# Patient Record
Sex: Male | Born: 1975 | Race: White | Hispanic: Yes | Marital: Single | Smoking: Never smoker
Health system: Southern US, Community
[De-identification: ages and names within clinical notes are randomized; demographics above are authoritative.]

## PROBLEM LIST (undated history)

## (undated) DIAGNOSIS — G8929 Other chronic pain: Secondary | ICD-10-CM

## (undated) DIAGNOSIS — F32A Depression, unspecified: Secondary | ICD-10-CM

## (undated) DIAGNOSIS — D649 Anemia, unspecified: Secondary | ICD-10-CM

## (undated) DIAGNOSIS — I10 Essential (primary) hypertension: Secondary | ICD-10-CM

## (undated) HISTORY — DX: Depression, unspecified: F32.A

## (undated) HISTORY — DX: Anemia, unspecified: D64.9

## (undated) HISTORY — PX: CHOLECYSTECTOMY: SHX55

## (undated) HISTORY — DX: Other chronic pain: G89.29

## (undated) HISTORY — PX: UPPER GASTROINTESTINAL ENDOSCOPY: SHX188

## (undated) HISTORY — DX: Essential (primary) hypertension: I10

## (undated) HISTORY — PX: APPENDECTOMY: SHX54

---

## 2017-09-10 ENCOUNTER — Emergency Department (HOSPITAL_BASED_OUTPATIENT_CLINIC_OR_DEPARTMENT_OTHER)
Admission: EM | Admit: 2017-09-10 | Discharge: 2017-09-10 | Disposition: A | Discharge status: Home / Self Care | Payer: BLUE CROSS/BLUE SHIELD | Attending: Emergency Medicine | Admitting: Emergency Medicine

## 2017-09-10 ENCOUNTER — Encounter (HOSPITAL_BASED_OUTPATIENT_CLINIC_OR_DEPARTMENT_OTHER): Payer: Self-pay

## 2017-09-10 ENCOUNTER — Other Ambulatory Visit: Payer: Self-pay

## 2017-09-10 ENCOUNTER — Emergency Department (HOSPITAL_BASED_OUTPATIENT_CLINIC_OR_DEPARTMENT_OTHER): Payer: BLUE CROSS/BLUE SHIELD

## 2017-09-10 DIAGNOSIS — K529 Noninfective gastroenteritis and colitis, unspecified: Secondary | ICD-10-CM | POA: Diagnosis not present

## 2017-09-10 DIAGNOSIS — R112 Nausea with vomiting, unspecified: Secondary | ICD-10-CM | POA: Insufficient documentation

## 2017-09-10 DIAGNOSIS — R1011 Right upper quadrant pain: Secondary | ICD-10-CM | POA: Diagnosis present

## 2017-09-10 LAB — COMPREHENSIVE METABOLIC PANEL
ALT: 27 U/L (ref 17–63)
AST: 29 U/L (ref 15–41)
Albumin: 4.9 g/dL (ref 3.5–5.0)
Alkaline Phosphatase: 120 U/L (ref 38–126)
Anion gap: 13 (ref 5–15)
BUN: 37 mg/dL — ABNORMAL HIGH (ref 6–20)
CO2: 21 mmol/L — ABNORMAL LOW (ref 22–32)
Calcium: 9.4 mg/dL (ref 8.9–10.3)
Chloride: 103 mmol/L (ref 101–111)
Creatinine, Ser: 1.63 mg/dL — ABNORMAL HIGH (ref 0.61–1.24)
GFR calc Af Amer: 59 mL/min — ABNORMAL LOW (ref >60)
GFR calc non Af Amer: 50 mL/min — ABNORMAL LOW (ref >60)
Glucose, Bld: 125 mg/dL — ABNORMAL HIGH (ref 65–99)
Potassium: 3.3 mmol/L — ABNORMAL LOW (ref 3.5–5.1)
Sodium: 137 mmol/L (ref 135–145)
Total Bilirubin: 0.6 mg/dL (ref 0.3–1.2)
Total Protein: 8.3 g/dL — ABNORMAL HIGH (ref 6.5–8.1)

## 2017-09-10 LAB — CBC WITH DIFFERENTIAL/PLATELET
Basophils Absolute: 0 10*3/uL (ref 0.0–0.1)
Basophils Relative: 0 %
Eosinophils Absolute: 0 10*3/uL (ref 0.0–0.7)
Eosinophils Relative: 0 %
HCT: 47.4 % (ref 39.0–52.0)
Hemoglobin: 16.3 g/dL (ref 13.0–17.0)
Lymphocytes Relative: 6 %
Lymphs Abs: 0.9 10*3/uL (ref 0.7–4.0)
MCH: 26.5 pg (ref 26.0–34.0)
MCHC: 34.4 g/dL (ref 30.0–36.0)
MCV: 76.9 fL — ABNORMAL LOW (ref 78.0–100.0)
Monocytes Absolute: 1 10*3/uL (ref 0.1–1.0)
Monocytes Relative: 7 %
Neutro Abs: 13.1 10*3/uL — ABNORMAL HIGH (ref 1.7–7.7)
Neutrophils Relative %: 87 %
Platelets: 318 10*3/uL (ref 150–400)
RBC: 6.16 MIL/uL — ABNORMAL HIGH (ref 4.22–5.81)
RDW: 14.9 % (ref 11.5–15.5)
WBC: 15 10*3/uL — ABNORMAL HIGH (ref 4.0–10.5)

## 2017-09-10 LAB — URINALYSIS, MICROSCOPIC (REFLEX): Bacteria, UA: NONE SEEN

## 2017-09-10 LAB — URINALYSIS, ROUTINE W REFLEX MICROSCOPIC
Bilirubin Urine: NEGATIVE
Glucose, UA: NEGATIVE mg/dL
Ketones, ur: 15 mg/dL — AB
Leukocytes, UA: NEGATIVE
Nitrite: NEGATIVE
Protein, ur: NEGATIVE mg/dL
Specific Gravity, Urine: 1.01 (ref 1.005–1.030)
pH: 6 (ref 5.0–8.0)

## 2017-09-10 LAB — LIPASE, BLOOD: Lipase: 21 U/L (ref 11–51)

## 2017-09-10 MED ORDER — SODIUM CHLORIDE 0.9 % IV BOLUS
1000.0000 mL | Freq: Once | INTRAVENOUS | Status: AC
  Administered 2017-09-10: 1000 mL via INTRAVENOUS

## 2017-09-10 MED ORDER — FAMOTIDINE 20 MG PO TABS: 20.0000 mg | ORAL_TABLET | Freq: Two times a day (BID) | ORAL | 0 refills | Status: DC

## 2017-09-10 MED ORDER — METOCLOPRAMIDE HCL 10 MG PO TABS: 10.0000 mg | ORAL_TABLET | Freq: Four times a day (QID) | ORAL | 0 refills | Status: DC

## 2017-09-10 MED ORDER — HYDROCODONE-ACETAMINOPHEN 5-325 MG PO TABS: 1.0000 | ORAL_TABLET | Freq: Four times a day (QID) | ORAL | 0 refills | Status: DC | PRN

## 2017-09-10 MED ORDER — IOPAMIDOL (ISOVUE-300) INJECTION 61%
100.0000 mL | Freq: Once | INTRAVENOUS | Status: AC | PRN
  Administered 2017-09-10: 100 mL via INTRAVENOUS

## 2017-09-10 MED ORDER — ONDANSETRON HCL 4 MG PO TABS: 4.0000 mg | ORAL_TABLET | Freq: Four times a day (QID) | ORAL | 0 refills | Status: DC

## 2017-09-10 MED ORDER — CIPROFLOXACIN HCL 500 MG PO TABS: 500.0000 mg | ORAL_TABLET | Freq: Two times a day (BID) | ORAL | 0 refills | Status: DC

## 2017-09-10 MED ORDER — ONDANSETRON HCL 4 MG/2ML IJ SOLN
4.0000 mg | Freq: Once | INTRAMUSCULAR | Status: AC
  Administered 2017-09-10: 4 mg via INTRAVENOUS
  Filled 2017-09-10: qty 2

## 2017-09-10 MED ORDER — MORPHINE SULFATE (PF) 4 MG/ML IV SOLN
4.0000 mg | Freq: Once | INTRAVENOUS | Status: AC
  Administered 2017-09-10: 4 mg via INTRAVENOUS
  Filled 2017-09-10: qty 1

## 2017-09-10 MED ORDER — METOCLOPRAMIDE HCL 5 MG/ML IJ SOLN
10.0000 mg | Freq: Once | INTRAMUSCULAR | Status: AC
  Administered 2017-09-10: 10 mg via INTRAVENOUS
  Filled 2017-09-10: qty 2

## 2017-09-10 MED ORDER — METRONIDAZOLE 500 MG PO TABS: 500.0000 mg | ORAL_TABLET | Freq: Three times a day (TID) | ORAL | 0 refills | Status: DC

## 2017-09-10 NOTE — ED Triage Notes (Signed)
Emesis and abdominal pain x 2 days. Diarrhea yesterday. No sick contacts. Sent from [facility].

## 2017-09-10 NOTE — ED Provider Notes (Signed)
[facility] EMERGENCY DEPARTMENT Provider Note   CSN: [id number] Arrival date & time: 09/10/17  1638     History   Chief Complaint Chief Complaint  Patient presents with  . Abdominal Pain  . Emesis    HPI Nathan Hanna. is a 42 y.o. male with history of appendectomy and cholecystectomy who presents with a 2-day history of abdominal pain, nausea, vomiting.  Patient had one episode of nonbloody diarrhea yesterday.  Patient describes his pain is sharp and constant.  He has felt warm at home, but no documented fever.  He was evaluated in urgent care prior to arrival who sent him here for further evaluation.  No medications taken prior to arrival.  Patient denies any chest pain, shortness of breath, urinary symptoms, back pain.  HPI  History reviewed. No pertinent past medical history.  There are no active problems to display for this patient.   Past Surgical History:  Procedure Laterality Date  . APPENDECTOMY    . CHOLECYSTECTOMY          Home Medications    Prior to Admission medications   Medication Sig Start Date End Date Taking? Authorizing Provider  citalopram (CELEXA) 40 MG tablet Take 40 mg by mouth daily.   Yes [provider]  verapamil (VERELAN PM) 240 MG 24 hr capsule Take 240 mg by mouth at bedtime.   Yes [provider]  ciprofloxacin (CIPRO) 500 MG tablet Take 1 tablet (500 mg total) by mouth every 12 (twelve) hours. 09/10/17   Tyvon Eggenberger, Waylan Boga, PA-C  famotidine (PEPCID) 20 MG tablet Take 1 tablet (20 mg total) by mouth 2 (two) times daily. 09/10/17   Clodagh Odenthal, Waylan Boga, PA-C  HYDROcodone-acetaminophen (NORCO/VICODIN) 5-325 MG tablet Take 1-2 tablets by mouth every 6 (six) hours as needed. 09/10/17   Merelin Human, Waylan Boga, PA-C  metoCLOPramide (REGLAN) 10 MG tablet Take 1 tablet (10 mg total) by mouth every 6 (six) hours. 09/10/17   Joandry Slagter, Waylan Boga, PA-C  metroNIDAZOLE (FLAGYL) 500 MG tablet Take 1 tablet (500 mg total) by mouth 3  (three) times daily. 09/10/17   Emi Holes, PA-C    Family History No family history on file.  Social History Social History   Tobacco Use  . Smoking status: Never Smoker  . Smokeless tobacco: Never Used  Substance Use Topics  . Alcohol use: Not Currently  . Drug use: Not Currently     Allergies   Patient has no known allergies.   Review of Systems Review of Systems  Constitutional: Negative for chills and fever.  HENT: Negative for facial swelling and sore throat.   Respiratory: Negative for shortness of breath.   Cardiovascular: Negative for chest pain.  Gastrointestinal: Positive for abdominal pain, diarrhea, nausea and vomiting.  Genitourinary: Negative for dysuria.  Musculoskeletal: Negative for back pain.  Skin: Negative for rash and wound.  Neurological: Negative for headaches.  Psychiatric/Behavioral: The patient is not nervous/anxious.      Physical Exam Updated Vital Signs BP 132/81 (BP Location: Right Arm)   Pulse 63   Temp 98.5 F (36.9 C) (Oral)   Resp 18   Ht 5\' 3"  (1.6 m)   Wt 81.6 kg (180 lb)   SpO2 100%   BMI 31.89 kg/m   Physical Exam  Constitutional: He appears well-developed and well-nourished. No distress.  HENT:  Head: Normocephalic and atraumatic.  Mouth/Throat: Oropharynx is clear and moist. No oropharyngeal exudate.  Eyes: Pupils are equal, round, and reactive to  light. Conjunctivae are normal. Right eye exhibits no discharge. Left eye exhibits no discharge. No scleral icterus.  Neck: Normal range of motion. Neck supple. No thyromegaly present.  Cardiovascular: Normal rate, regular rhythm, normal heart sounds and intact distal pulses. Exam reveals no gallop and no friction rub.  No murmur heard. Pulmonary/Chest: Effort normal and breath sounds normal. No stridor. No respiratory distress. He has no wheezes. He has no rales.  Abdominal: Soft. Bowel sounds are normal. He exhibits no distension. There is tenderness. There is no  rebound, no guarding, no CVA tenderness, no tenderness at McBurney's point and negative Murphy's sign.    Musculoskeletal: He exhibits no edema.  Lymphadenopathy:    He has no cervical adenopathy.  Neurological: He is alert. Coordination normal.  Skin: Skin is warm and dry. No rash noted. He is not diaphoretic. No pallor.  Psychiatric: He has a normal mood and affect.  Nursing note and vitals reviewed.    ED Treatments / Results  Labs (all labs ordered are listed, but only abnormal results are displayed) Labs Reviewed  COMPREHENSIVE METABOLIC PANEL - Abnormal; Notable for the following components:      Result Value   Potassium 3.3 (*)    CO2 21 (*)    Glucose, Bld 125 (*)    BUN 37 (*)    Creatinine, Ser 1.63 (*)    Total Protein 8.3 (*)    GFR calc non Af Amer 50 (*)    GFR calc Af Amer 59 (*)    All other components within normal limits  CBC WITH DIFFERENTIAL/PLATELET - Abnormal; Notable for the following components:   WBC 15.0 (*)    RBC 6.16 (*)    MCV 76.9 (*)    Neutro Abs 13.1 (*)    All other components within normal limits  URINALYSIS, ROUTINE W REFLEX MICROSCOPIC - Abnormal; Notable for the following components:   Hgb urine dipstick TRACE (*)    Ketones, ur 15 (*)    All other components within normal limits  URINALYSIS, MICROSCOPIC (REFLEX) - Abnormal; Notable for the following components:   Squamous Epithelial / LPF 0-5 (*)    All other components within normal limits  LIPASE, BLOOD    EKG None  Radiology Ct Abdomen Pelvis W Contrast  Result Date: 09/10/2017 CLINICAL DATA:  Midline abdominal pain for 2 days, nausea, vomiting and diarrhea. History of appendectomy and cholecystectomy. EXAM: CT ABDOMEN AND PELVIS WITH CONTRAST TECHNIQUE: Multidetector CT imaging of the abdomen and pelvis was performed using the standard protocol following bolus administration of intravenous contrast. CONTRAST:  100mL ISOVUE-300 IOPAMIDOL (ISOVUE-300) INJECTION 61%  COMPARISON:  CT abdomen and pelvis report April 13, 2011 though images are not available for direct comparison. FINDINGS: LOWER CHEST: Lung bases are clear. Included heart size is normal. No pericardial effusion. Subcentimeter RIGHT posterior mediastinal lymph node or cyst. Mild gas distended distal esophagus with air-fluid level most compatible with reflux. HEPATOBILIARY: Mild focal fatty infiltration about the falciform ligament. Status post cholecystectomy. PANCREAS: Normal. SPLEEN: Normal. ADRENALS/URINARY TRACT: Kidneys are orthotopic, demonstrating symmetric enhancement. No nephrolithiasis, hydronephrosis or solid renal masses. The unopacified ureters are normal in course and caliber. Delayed imaging through the kidneys demonstrates symmetric prompt contrast excretion within the proximal urinary collecting system. Urinary bladder is partially distended, normal. Normal adrenal glands. STOMACH/BOWEL: Mild fluid distended small and large bowel with air-fluid levels, multiple mild transition points. No abnormal bowel wall thickening. No pneumatosis. VASCULAR/LYMPHATIC: Aortoiliac vessels are normal in course and caliber. No  lymphadenopathy by CT size criteria. REPRODUCTIVE: Normal. OTHER: No intraperitoneal free fluid or free air. MUSCULOSKELETAL: Nonacute. Sacroiliac osteophytes. Severe L5-S1 degenerative disc. Mild to moderate lower lumbar facet arthropathy. Severe RIGHT L5-S1 neural foraminal narrowing. IMPRESSION: 1. Small and large bowel air-fluid levels seen with enteritis, no complication. 2. Status post cholecystectomy and appendectomy. Electronically Signed   By: Awilda Metro M.D.   On: 09/10/2017 18:05    Procedures Procedures (including critical care time)  Medications Ordered in ED Medications  morphine 4 MG/ML injection 4 mg (4 mg Intravenous Given 09/10/17 1743)  ondansetron (ZOFRAN) injection 4 mg (4 mg Intravenous Given 09/10/17 1743)  iopamidol (ISOVUE-300) 61 % injection 100 mL  (100 mLs Intravenous Contrast Given 09/10/17 1729)  sodium chloride 0.9 % bolus 1,000 mL (0 mLs Intravenous Stopped 09/10/17 1758)  sodium chloride 0.9 % bolus 1,000 mL (0 mLs Intravenous Stopped 09/10/17 1931)  morphine 4 MG/ML injection 4 mg (4 mg Intravenous Given 09/10/17 1930)  ondansetron (ZOFRAN) injection 4 mg (4 mg Intravenous Given 09/10/17 1955)  metoCLOPramide (REGLAN) injection 10 mg (10 mg Intravenous Given 09/10/17 2028)     Initial Impression / Assessment and Plan / ED Course  I have reviewed the triage vital signs and the nursing notes.  Pertinent labs & imaging results that were available during my care of the patient were reviewed by me and considered in my medical decision making (see chart for details).     Patient with enteritis seen on CT scan in the small and large bowel.  WBC 15.0.  CMP shows BUN 37, creatinine 1.63.  Suspect due to dehydration.  Patient given 2 L of NS in the ED.  Will have patient recheck to PCP in 2-3 days.  Pain and nausea controlled in the ED.  Will discharge home with Cipro, Flagyl to cover for bacterial enteritis, as well as Reglan, Pepcid, and short course of Norco.  I reviewed the [place] narcotic database and found no discrepancies.  Return precautions discussed.  Patient understands and agrees with plan.  Patient vitals stable throughout ED course and discharged in satisfactory condition. I discussed patient case with Dr. Denton Lank who guided the patient's management and agrees with plan.   Final Clinical Impressions(s) / ED Diagnoses   Final diagnoses:  Non-intractable vomiting with nausea, unspecified vomiting type  Right upper quadrant abdominal pain  Enteritis    ED Discharge Orders        Ordered    ciprofloxacin (CIPRO) 500 MG tablet  Every 12 hours     09/10/17 2016    metroNIDAZOLE (FLAGYL) 500 MG tablet  3 times daily     09/10/17 2016    HYDROcodone-acetaminophen (NORCO/VICODIN) 5-325 MG tablet  Every 6 hours PRN,   Status:   Discontinued     09/10/17 2016    ondansetron (ZOFRAN) 4 MG tablet  Every 6 hours,   Status:  Discontinued     09/10/17 2016    famotidine (PEPCID) 20 MG tablet  2 times daily     09/10/17 2016    HYDROcodone-acetaminophen (NORCO/VICODIN) 5-325 MG tablet  Every 6 hours PRN     09/10/17 2032    metoCLOPramide (REGLAN) 10 MG tablet  Every 6 hours     09/10/17 2032       Emi Holes, PA-C 09/10/17 2037    Cathren Laine, MD 09/10/17 2244

## 2017-09-10 NOTE — ED Notes (Signed)
Pt attempting to urinate at this time

## 2017-09-10 NOTE — Discharge Instructions (Addendum)
Medications: Cipro, Flagyl, Reglan, Pepcid, Norco  Treatment: Take Cipro and Flagyl until completed.  Do not drink alcohol with Flagyl.  Take Reglan every 6 hours as needed for nausea or vomiting.  Take Pepcid twice daily as prescribed to help with irritation in your stomach lining.  Take 1-2 Norco every 6 hours as needed for your pain.   Do not drink alcohol, drive, operate machinery or participate in any other potentially dangerous activities while taking opiate pain medication as it may make you sleepy. Do not take this medication with any other sedating medications, either prescription or over-the-counter. If you were prescribed Percocet or Vicodin, do not take these with acetaminophen (Tylenol) as it is already contained within these medications and overdose of Tylenol is dangerous.   This medication is an opiate (or narcotic) pain medication and can be habit forming.  Use it as little as possible to achieve adequate pain control.  Do not use or use it with extreme caution if you have a history of opiate abuse or dependence. This medication is intended for your use only - do not give any to anyone else and keep it in a secure place where nobody else, especially children, have access to it. It will also cause or worsen constipation, so you may want to consider taking an over-the-counter stool softener while you are taking this medication.  Follow-up: Please see your doctor in 2-3 days for recheck of symptoms as well as your kidney function.  You are very dehydrated today.  Please return to the emergency department if you develop any new or worsening symptoms including severe, worsening abdominal pain, intractable vomiting, passing out, or any other new or concerning symptom.

## 2017-09-10 NOTE — ED Notes (Signed)
Pt returned from CT °

## 2020-08-06 ENCOUNTER — Encounter (HOSPITAL_BASED_OUTPATIENT_CLINIC_OR_DEPARTMENT_OTHER): Payer: Self-pay

## 2020-08-06 ENCOUNTER — Emergency Department (HOSPITAL_BASED_OUTPATIENT_CLINIC_OR_DEPARTMENT_OTHER): Payer: Worker's Compensation

## 2020-08-06 ENCOUNTER — Other Ambulatory Visit: Payer: Self-pay

## 2020-08-06 ENCOUNTER — Emergency Department (HOSPITAL_BASED_OUTPATIENT_CLINIC_OR_DEPARTMENT_OTHER)
Admission: EM | Admit: 2020-08-06 | Discharge: 2020-08-06 | Disposition: A | Discharge status: Home / Self Care | Payer: Worker's Compensation | Attending: Emergency Medicine | Admitting: Emergency Medicine

## 2020-08-06 DIAGNOSIS — Y92512 Supermarket, store or market as the place of occurrence of the external cause: Secondary | ICD-10-CM | POA: Insufficient documentation

## 2020-08-06 DIAGNOSIS — R109 Unspecified abdominal pain: Secondary | ICD-10-CM | POA: Diagnosis not present

## 2020-08-06 DIAGNOSIS — S8012XA Contusion of left lower leg, initial encounter: Secondary | ICD-10-CM | POA: Diagnosis not present

## 2020-08-06 DIAGNOSIS — W228XXA Striking against or struck by other objects, initial encounter: Secondary | ICD-10-CM | POA: Insufficient documentation

## 2020-08-06 DIAGNOSIS — S8992XA Unspecified injury of left lower leg, initial encounter: Secondary | ICD-10-CM | POA: Diagnosis present
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
## 2020-08-20 ENCOUNTER — Encounter: Payer: Self-pay | Admitting: Nurse Practitioner

## 2020-09-17 ENCOUNTER — Ambulatory Visit (INDEPENDENT_AMBULATORY_CARE_PROVIDER_SITE_OTHER): Payer: BC Managed Care – PPO | Admitting: Nurse Practitioner

## 2020-09-17 ENCOUNTER — Encounter: Payer: Self-pay | Admitting: Nurse Practitioner

## 2020-09-17 ENCOUNTER — Other Ambulatory Visit: Payer: Self-pay

## 2020-09-17 VITALS — BP 130/82 | HR 89 | Ht 62.0 in | Wt 196.2 lb

## 2020-09-17 DIAGNOSIS — R195 Other fecal abnormalities: Secondary | ICD-10-CM

## 2020-09-17 DIAGNOSIS — R112 Nausea with vomiting, unspecified: Secondary | ICD-10-CM | POA: Diagnosis not present

## 2020-09-17 DIAGNOSIS — K219 Gastro-esophageal reflux disease without esophagitis: Secondary | ICD-10-CM | POA: Diagnosis not present

## 2020-09-17 DIAGNOSIS — D509 Iron deficiency anemia, unspecified: Secondary | ICD-10-CM | POA: Diagnosis not present

## 2020-09-17 MED ORDER — PLENVU 140 G PO SOLR: 1.0000 | Freq: Once | ORAL | 0 refills | Status: AC

## 2020-09-17 MED ORDER — ONDANSETRON HCL 4 MG PO TABS: ORAL_TABLET | ORAL | 0 refills | Status: DC

## 2020-09-17 NOTE — Progress Notes (Signed)
ASSESSMENT AND PLAN    #45 year old male with positive IFOBT+, iron deficiency anemia ( now on iron), chronic, intermittent small volume rectal bleeding,  --Will scheduled for EGD and colonoscopy. The risks and benefits of EGD and colonoscopy with possible polypectomy / biopsies were discussed and the patient agrees to proceed.  --Hold iron 10 days prior to procedures.   # Chronic nausea / vomiting evaluated in [place] in 2008 and sounds like extensive evaluation was negative ( per Aunt who accompanies him today).  --Trial of Zofran prior to each dose of bowel prep.   # ? Vitamin B12 deficiency, level  was low normal at 215 .  Now on B12 replacement  # Chronic GERD --Continue daily Protonix --Be sure to take you anti-reflux medication 30 minutes before meal(s) --Anti-reflux measures discussed including avoidance of trigger foods and evening meals / bedtime snacks. If able, elevate head of bed 6-8 inches. If unable to elevate the head of the bed consider a wedge pillow.   Weight reduction / maintaining a healthy BMI) as increased abdominal girth is associated with reflux.   HISTORY OF PRESENT ILLNESS     Chief Complaint : rectal bleeding, anemia, chronic nausea / vomiting and history of GERD  Nathan Hanna. is a 45 y.o. male with a past medical history significant for iron deficiency anemia, depression, migraines, appendectomy, cholecystectomy, GERD  Patient is referred by PCP for evaluation of GERD. However , he also has iron deficiency and recently had a positive IFOBT  Patient diagnosed with iron deficiency February 2021. Labs reviewed in Care Everywhere. Ferritin was 6 at the time. He was started on iron.  In March 2022 ferritin was only 27, iron saturation of 6%.  Patient says he was compliant in taking the iron. His dose was increased to BID. He also gives a history of occasional painless rectal bleeding for months. He denies constipation. He gets occasional loose stool  depending on diet. No FMH of colon cancer. He hasn't had any black stools, doesn't take NSAIDs on a regular basis. He doesn't donate blood, no epistaxis or hematuria.  No abdominal pain. He has chronic intermittent nausea and vomiting ( sometimes related to migraines). His aunt is present and says this was evaluated in 2008 in [place] and only finding was that his esophagus " was slow".   Patient give a history of chronic GERD with heartburn. He has been taking Protonix for a year which helps. It was prescribed to be taken BID by PCP but he has only taking it once daily with good control of symptoms   Data Reviewed:  07/30/20 [facility] Hgb 14.6, MCV 80, ferritin 27,  TIBC 412, 7 % iron saturation Liver and renal tests normal   Past Medical History:  Diagnosis Date  . Anemia   . Chronic headaches   . Depression   . Hypertension     Past Surgical History:  Procedure Laterality Date  . APPENDECTOMY    . CHOLECYSTECTOMY     No family history on file. Social History   Tobacco Use  . Smoking status: Never Smoker  . Smokeless tobacco: Never Used  Substance Use Topics  . Alcohol use: Yes  . Drug use: Not Currently   Current Outpatient Medications  Medication Sig Dispense Refill  . citalopram (CELEXA) 40 MG tablet Take 40 mg by mouth daily.    . meclizine (ANTIVERT) 25 MG tablet Take 25 mg by mouth 3 (three) times daily as  needed for dizziness. Prn    . pantoprazole (PROTONIX) 20 MG tablet Take 20 mg by mouth daily.    . SUMAtriptan (IMITREX) 50 MG tablet Take 50 mg by mouth every 2 (two) hours as needed for migraine. May repeat in 2 hours if headache persists or recurs.    . verapamil (CALAN-SR) 120 MG CR tablet Take 120 mg by mouth at bedtime.     No current facility-administered medications for this visit.   No Known Allergies   Review of Systems: Positive for headaches.  All other systems reviewed and negative except where noted in HPI.   PHYSICAL EXAM :    Wt Readings from  Last 3 Encounters:  08/06/20 208 lb (94.3 kg)  09/10/17 180 lb (81.6 kg)    BP 130/82   Pulse 89   Ht 5\' 2"  (1.575 m)   Wt 196 lb 3.2 oz (89 kg)   SpO2 99%   BMI 35.89 kg/m  Constitutional:  Pleasant male in no acute distress. Psychiatric: Normal mood and affect. Behavior is normal. EENT: Pupils normal.  Conjunctivae are normal. No scleral icterus. Neck supple.  Cardiovascular: Normal rate, regular rhythm. No edema Pulmonary/chest: Effort normal and breath sounds normal. No wheezing, rales or rhonchi. Abdominal: Soft, nondistended, nontender. Bowel sounds active throughout. There are no masses palpable. No hepatomegaly. Neurological: Alert and oriented to person place and time. Skin: Skin is warm and dry. No rashes noted.  , NP  09/17/2020, 8:45 AM  Cc:  Referring Provider 09/19/2020., MD

## 2020-09-17 NOTE — Patient Instructions (Signed)
We have sent the following medications to your pharmacy for you to pick up at your convenience:  Zofran - take 1 tablet about 30 minutes before drinking each half of the prep.  You have been scheduled for an endoscopy. Please follow written instructions given to you at your visit today. If you use inhalers (even only as needed), please bring them with you on the day of your procedure.

## 2020-09-18 NOTE — Progress Notes (Signed)
Agree with assessment and plan as outlined.  

## 2020-11-18 ENCOUNTER — Ambulatory Visit (AMBULATORY_SURGERY_CENTER): Payer: BC Managed Care – PPO | Admitting: Gastroenterology

## 2020-11-18 ENCOUNTER — Other Ambulatory Visit: Payer: Self-pay

## 2020-11-18 ENCOUNTER — Encounter: Payer: Self-pay | Admitting: Gastroenterology

## 2020-11-18 VITALS — BP 102/64 | HR 58 | Temp 98.9°F | Resp 16 | Ht 62.0 in | Wt 196.0 lb

## 2020-11-18 DIAGNOSIS — K449 Diaphragmatic hernia without obstruction or gangrene: Secondary | ICD-10-CM | POA: Diagnosis not present

## 2020-11-18 DIAGNOSIS — K2970 Gastritis, unspecified, without bleeding: Secondary | ICD-10-CM

## 2020-11-18 DIAGNOSIS — K3189 Other diseases of stomach and duodenum: Secondary | ICD-10-CM | POA: Diagnosis not present

## 2020-11-18 DIAGNOSIS — K5730 Diverticulosis of large intestine without perforation or abscess without bleeding: Secondary | ICD-10-CM | POA: Diagnosis not present

## 2020-11-18 DIAGNOSIS — K648 Other hemorrhoids: Secondary | ICD-10-CM

## 2020-11-18 DIAGNOSIS — D509 Iron deficiency anemia, unspecified: Secondary | ICD-10-CM

## 2020-11-18 DIAGNOSIS — K2950 Unspecified chronic gastritis without bleeding: Secondary | ICD-10-CM

## 2020-11-18 DIAGNOSIS — K219 Gastro-esophageal reflux disease without esophagitis: Secondary | ICD-10-CM

## 2020-11-18 MED ORDER — SODIUM CHLORIDE 0.9 % IV SOLN: 500.0000 mL | Freq: Once | INTRAVENOUS | Status: DC

## 2020-11-18 NOTE — Progress Notes (Signed)
Called to room to assist during endoscopic procedure.  Patient ID and intended procedure confirmed with present staff. Received instructions for my participation in the procedure from the performing physician.  

## 2020-11-18 NOTE — Patient Instructions (Signed)
YOU HAD AN ENDOSCOPIC PROCEDURE TODAY AT THE [facility]:   Refer to the procedure report that was given to you for any specific questions about what was found during the examination.  If the procedure report does not answer your questions, please call your gastroenterologist to clarify.  If you requested that your care partner not be given the details of your procedure findings, then the procedure report has been included in a sealed envelope for you to review at your convenience later.  YOU SHOULD EXPECT: Some feelings of bloating in the abdomen. Passage of more gas than usual.  Walking can help get rid of the air that was put into your GI tract during the procedure and reduce the bloating. If you had a lower endoscopy (such as a colonoscopy or flexible sigmoidoscopy) you may notice spotting of blood in your stool or on the toilet paper. If you underwent a bowel prep for your procedure, you may not have a normal bowel movement for a few days.  Please Note:  You might notice some irritation and congestion in your nose or some drainage.  This is from the oxygen used during your procedure.  There is no need for concern and it should clear up in a day or so.  SYMPTOMS TO REPORT IMMEDIATELY:   Following lower endoscopy (colonoscopy or flexible sigmoidoscopy):  Excessive amounts of blood in the stool  Significant tenderness or worsening of abdominal pains  Swelling of the abdomen that is new, acute  Fever of 100F or higher   Following upper endoscopy (EGD)  Vomiting of blood or coffee ground material  New chest pain or pain under the shoulder blades  Painful or persistently difficult swallowing  New shortness of breath  Fever of 100F or higher  Black, tarry-looking stools  For urgent or emergent issues, a gastroenterologist can be reached at any hour by calling [phone]. Do not use MyChart messaging for urgent concerns.    DIET:  We do recommend a small meal at first, but  then you may proceed to your regular diet.  Drink plenty of fluids but you should avoid alcoholic beverages for 24 hours.  ACTIVITY:  You should plan to take it easy for the rest of today and you should NOT DRIVE or use heavy machinery until tomorrow (because of the sedation medicines used during the test).    FOLLOW UP: Our staff will call the number listed on your records 48-72 hours following your procedure to check on you and address any questions or concerns that you may have regarding the information given to you following your procedure. If we do not reach you, we will leave a message.  We will attempt to reach you two times.  During this call, we will ask if you have developed any symptoms of COVID 19. If you develop any symptoms (ie: fever, flu-like symptoms, shortness of breath, cough etc.) before then, please call [phone].  If you test positive for Covid 19 in the 2 weeks post procedure, please call and report this information to us.    If any biopsies were taken you will be contacted by phone or by letter within the next 1-3 weeks.  Please call us at [phone] if you have not heard about the biopsies in 3 weeks.    SIGNATURES/CONFIDENTIALITY: You and/or your care partner have signed paperwork which will be entered into your electronic medical record.  These signatures attest to the fact that that the information above on   your After Visit Summary has been reviewed and is understood.  Full responsibility of the confidentiality of this discharge information lies with you and/or your care-partner. 

## 2020-11-18 NOTE — Progress Notes (Signed)
VS by CW  I have reviewed the patient's medical history in detail and updated the computerized patient record.  

## 2020-11-18 NOTE — Progress Notes (Signed)
PT taken to PACU. Monitors in place. VSS. Report given to RN. 

## 2020-11-18 NOTE — Op Note (Signed)
[facility] Patient Name: Nathan Hanna Procedure Date: 11/18/2020 1:30 PM MRN: [id number] Endoscopist: Viviann Spare P. Adela Lank , MD Age: 45 Referring MD:  Date of Birth: [birth year] Gender: Male Account #: [id number] Procedure:                Upper GI endoscopy Indications:              Iron deficiency anemia, Follow-up of                            gastro-esophageal reflux disease, Nausea with                            vomiting - on protonix 20mg  / day Medicines:                Monitored Anesthesia Care Procedure:                Pre-Anesthesia Assessment:                           - Prior to the procedure, a History and Physical                            was performed, and patient medications and                            allergies were reviewed. The patient's tolerance of                            previous anesthesia was also reviewed. The risks                            and benefits of the procedure and the sedation                            options and risks were discussed with the patient.                            All questions were answered, and informed consent                            was obtained. Prior Anticoagulants: The patient has                            taken no previous anticoagulant or antiplatelet                            agents. ASA Grade Assessment: II - A patient with                            mild systemic disease. After reviewing the risks                            and benefits, the patient was deemed in  satisfactory condition to undergo the procedure.                           After obtaining informed consent, the endoscope was                            passed under direct vision. Throughout the                            procedure, the patient's blood pressure, pulse, and                            oxygen saturations were monitored continuously. The                            Endoscope was introduced through  the mouth, and                            advanced to the second part of duodenum. The upper                            GI endoscopy was accomplished without difficulty.                            The patient tolerated the procedure well. Scope In: Scope Out: Findings:                 Esophagogastric landmarks were identified: the                            Z-line was found at 35 cm, the gastroesophageal                            junction was found at 35 cm and the upper extent of                            the gastric folds was found at 38 cm from the                            incisors.                           A 3 cm hiatal hernia was present.                           The exam of the esophagus was otherwise normal.                           The stomach had poor air retention due to hiatal                            hernia and laxity at the GEJ, retroflexed views  were limited. The entire examined stomach was                            normal. Biopsies were taken with a cold forceps for                            Helicobacter pylori testing.                           The duodenal bulb and second portion of the                            duodenum were normal. Biopsies for histology were                            taken with a cold forceps for evaluation of celiac                            disease. Complications:            No immediate complications. Estimated blood loss:                            Minimal. Estimated Blood Loss:     Estimated blood loss was minimal. Impression:               - Esophagogastric landmarks identified.                           - 3 cm hiatal hernia.                           - Poor air retention in the stomach                           - Normal stomach. Biopsied.                           - Normal duodenal bulb and second portion of the                            duodenum. Biopsied.                           No overt cause for  iron deficiency on this exam,                            will await biopsy results. Recommendation:           - Patient has a contact number available for                            emergencies. The signs and symptoms of potential                            delayed complications were discussed with the  patient. Return to normal activities tomorrow.                            Written discharge instructions were provided to the                            patient.                           - Resume previous diet.                           - Continue present medications.                           - Await pathology results.                           - Start ferrous sulfate 325mg  daily if not done so                            already, monitor Hgb / iron studies                           - based on pattern of breathing observed during                            anesthesia, would consider sleep study to rule out                            OSA if patient has not had this done before, can                            discuss with his primary care physician P. Viviann Spare, MD 11/18/2020 2:21:25 PM This report has been signed electronically.

## 2020-11-18 NOTE — Op Note (Signed)
[facility] Patient Name: Nathan Hanna Procedure Date: 11/18/2020 1:31 PM MRN: [id number] Endoscopist: Viviann Spare P. Adela Lank , MD Age: 45 Referring MD:  Date of Birth: [birth year] Gender: Male Account #: [id number] Procedure:                Colonoscopy Indications:              Iron deficiency anemia, history of B12 deficiency Medicines:                Monitored Anesthesia Care Procedure:                Pre-Anesthesia Assessment:                           - Prior to the procedure, a History and Physical                            was performed, and patient medications and                            allergies were reviewed. The patient's tolerance of                            previous anesthesia was also reviewed. The risks                            and benefits of the procedure and the sedation                            options and risks were discussed with the patient.                            All questions were answered, and informed consent                            was obtained. Prior Anticoagulants: The patient has                            taken no previous anticoagulant or antiplatelet                            agents. ASA Grade Assessment: II - A patient with                            mild systemic disease. After reviewing the risks                            and benefits, the patient was deemed in                            satisfactory condition to undergo the procedure.                           After obtaining informed consent, the colonoscope  was passed under direct vision. Throughout the                            procedure, the patient's blood pressure, pulse, and                            oxygen saturations were monitored continuously. The                            Olympus CF-HQ190L [id number]) Colonoscope was                            introduced through the anus and advanced to the the                            terminal  ileum, with identification of the                            appendiceal orifice and IC valve. The colonoscopy                            was performed without difficulty. The patient                            tolerated the procedure well. The quality of the                            bowel preparation was good. The terminal ileum,                            ileocecal valve, appendiceal orifice, and rectum                            were photographed. Scope In: 1:53:08 PM Scope Out: 2:12:53 PM Scope Withdrawal Time: 0 hours 11 minutes 55 seconds  Total Procedure Duration: 0 hours 19 minutes 45 seconds  Findings:                 The perianal and digital rectal examinations were                            normal.                           The terminal ileum appeared normal.                           Scattered small-mouthed diverticula were found in                            the entire colon.                           Internal hemorrhoids were found during retroflexion.  The exam was otherwise without abnormality. Complications:            No immediate complications. Estimated blood loss:                            None. Estimated Blood Loss:     Estimated blood loss: none. Impression:               - The examined portion of the ileum was normal.                           - Diverticulosis in the entire examined colon.                           - Internal hemorrhoids.                           - The examination was otherwise normal.                           - No polyps.                           No cause for anemia / iron deficiency on                            colonoscopy, await biopsies from EGD Recommendation:           - Patient has a contact number available for                            emergencies. The signs and symptoms of potential                            delayed complications were discussed with the                            patient. Return to  normal activities tomorrow.                            Written discharge instructions were provided to the                            patient.                           - Resume previous diet.                           - Continue present medications.                           - Further recommendations per EGD note Shelah Heatley P. Loudon Krakow, MD 11/18/2020 2:17:18 PM This report has been signed electronically.

## 2020-11-20 ENCOUNTER — Telehealth: Payer: Self-pay

## 2020-11-20 NOTE — Telephone Encounter (Signed)
  Follow up Call-  Call back number 11/18/2020  Post procedure Call Back phone  # [phone]  Permission to leave phone message Yes  Some recent data might be hidden     Patient questions:  Do you have a fever, pain , or abdominal swelling? No. Pain Score  0 *  Have you tolerated food without any problems? Yes.    Have you been able to return to your normal activities? Yes.    Do you have any questions about your discharge instructions: Diet   No. Medications  No. Follow up visit  No.  Do you have questions or concerns about your Care? No.  Actions: * If pain score is 4 or above: No action needed, pain <4.

## 2020-11-26 ENCOUNTER — Other Ambulatory Visit: Payer: Self-pay

## 2020-11-26 DIAGNOSIS — D509 Iron deficiency anemia, unspecified: Secondary | ICD-10-CM

## 2020-12-24 ENCOUNTER — Telehealth: Payer: Self-pay

## 2020-12-24 NOTE — Telephone Encounter (Signed)
Spoke with patient's mother to remind her that patient is due for repeat labs at this time. No appointment is necessary. Patient's mother is aware that patient can stop by the lab in the basement at his convenience between 7:30 AM - 5 PM, Monday through Friday. Patient's mother verbalized understanding and had no concerns at the end of the call. She will relay this information to the patient.

## 2020-12-24 NOTE — Telephone Encounter (Signed)
-----   Message from Missy Sabins, RN sent at 11/26/2020 12:42 PM EDT ----- Regarding: Labs Repeat IBC + Ferritin. Orders in epic.

## 2021-01-05 ENCOUNTER — Other Ambulatory Visit (INDEPENDENT_AMBULATORY_CARE_PROVIDER_SITE_OTHER): Payer: BC Managed Care – PPO

## 2021-01-05 DIAGNOSIS — D509 Iron deficiency anemia, unspecified: Secondary | ICD-10-CM | POA: Diagnosis not present

## 2021-01-05 LAB — IBC + FERRITIN
Ferritin: 49.3 ng/mL (ref 22.0–322.0)
Iron: 69 ug/dL (ref 42–165)
Saturation Ratios: 16.5 % — ABNORMAL LOW (ref 20.0–50.0)
TIBC: 417.2 ug/dL (ref 250.0–450.0)
Transferrin: 298 mg/dL (ref 212.0–360.0)

## 2021-01-06 ENCOUNTER — Other Ambulatory Visit: Payer: Self-pay

## 2021-01-06 DIAGNOSIS — D509 Iron deficiency anemia, unspecified: Secondary | ICD-10-CM

## 2021-04-08 ENCOUNTER — Telehealth: Payer: Self-pay

## 2021-04-08 NOTE — Telephone Encounter (Signed)
Okay thanks. He should continue present meds and repeat CBC in 6 months at this point.  If he has any recurrent symptoms otherwise he can follow up with me in the interim. Thanks

## 2021-04-08 NOTE — Telephone Encounter (Addendum)
Lm on vm for patient to return call.  Lab reminder in epic. Pt will be due for physical with his PCP in 28-months, will possibly have labs checked at that time.

## 2021-04-08 NOTE — Telephone Encounter (Signed)
-----   Message from Missy Sabins, RN sent at 01/06/2021 11:29 AM EDT ----- Regarding: Labs Repeat IBC + Ferritin, CBC. The orders are in epic.

## 2021-04-08 NOTE — Telephone Encounter (Signed)
Patient had labs drawn with PCP on 04/06/21. Lab results available in epic.

## 2021-04-09 NOTE — Telephone Encounter (Signed)
Called and spoke with patient's mom in regards to recommnedations. She was very apologetic about patient missing labs here. Advised that there was no need to apologize, advised that he still was on schedule with his repeat labs. Mother states that patient will see PCP in May for physical and labs. Advised that we will place a reminder in the system to check those results. Pt's mother verbalized understanding and had no concerns at the end of the call.  Lab reminder in epic.

## 2021-10-12 ENCOUNTER — Telehealth: Payer: Self-pay

## 2021-10-12 NOTE — Telephone Encounter (Signed)
-----   Message from Yevette Edwards, RN sent at 04/09/2021 11:21 AM EST ----- Regarding: Labs Check labs with PCP

## 2021-10-12 NOTE — Telephone Encounter (Signed)
Pt due for repeat labs. Pt had recent labs with PCP. Labs are available in Care Everywhere.

## 2021-10-12 NOTE — Telephone Encounter (Signed)
Called and spoke with patient's mother regarding Dr. Lanetta Inch recommendations. Pt's mother states that patient is taking iron once a day. I advised her to have patient increase iron to BID until his follow up appt. She is aware that this can cause constipation and cause pt's stools to turn black. Pt has been scheduled for a follow up with Dr. Adela Lank on Tuesday, 11/17/21 at 9:50 am. Pt's mother is aware that I will mail this appt information, she confirmed address on file. Pt's mother is aware that if pt remains anemic despite oral iron then he may need a VCE. Pt verbalized understanding and had no concerns at the end of the call.

## 2021-10-12 NOTE — Telephone Encounter (Signed)
Thanks for letting me know.  It appears his mild iron deficiency has recurred.  Not sure if he is still on iron or not when this happened.  Can you let him know to resume ferrous sulfate 325 mg daily.  I have not seen him in a while, I would like to see him back in the office for reassessment if you can help coordinate.  With recurrent IDA, especially if while on iron supplementation, he may need a capsule study.  Thanks

## 2021-11-17 ENCOUNTER — Ambulatory Visit: Payer: BC Managed Care – PPO | Admitting: Gastroenterology

## 2021-11-17 ENCOUNTER — Encounter: Payer: Self-pay | Admitting: Gastroenterology

## 2021-11-17 VITALS — BP 110/82 | HR 64 | Ht 62.0 in | Wt 195.0 lb

## 2021-11-17 DIAGNOSIS — D509 Iron deficiency anemia, unspecified: Secondary | ICD-10-CM

## 2021-11-17 DIAGNOSIS — K219 Gastro-esophageal reflux disease without esophagitis: Secondary | ICD-10-CM

## 2021-11-30 ENCOUNTER — Other Ambulatory Visit (INDEPENDENT_AMBULATORY_CARE_PROVIDER_SITE_OTHER): Payer: BC Managed Care – PPO

## 2021-11-30 DIAGNOSIS — D509 Iron deficiency anemia, unspecified: Secondary | ICD-10-CM | POA: Diagnosis not present

## 2021-11-30 DIAGNOSIS — K219 Gastro-esophageal reflux disease without esophagitis: Secondary | ICD-10-CM

## 2021-11-30 LAB — IBC + FERRITIN
Ferritin: 60.9 ng/mL (ref 22.0–322.0)
Iron: 64 ug/dL (ref 42–165)
Saturation Ratios: 15.5 % — ABNORMAL LOW (ref 20.0–50.0)
TIBC: 413 ug/dL (ref 250.0–450.0)
Transferrin: 295 mg/dL (ref 212.0–360.0)

## 2021-11-30 LAB — CBC WITH DIFFERENTIAL/PLATELET
Basophils Absolute: 0 10*3/uL (ref 0.0–0.1)
Basophils Relative: 0.5 % (ref 0.0–3.0)
Eosinophils Absolute: 0.1 10*3/uL (ref 0.0–0.7)
Eosinophils Relative: 1.7 % (ref 0.0–5.0)
HCT: 44.5 % (ref 39.0–52.0)
Hemoglobin: 14.7 g/dL (ref 13.0–17.0)
Lymphocytes Relative: 37.8 % (ref 12.0–46.0)
Lymphs Abs: 2.1 10*3/uL (ref 0.7–4.0)
MCHC: 33.1 g/dL (ref 30.0–36.0)
MCV: 83.5 fl (ref 78.0–100.0)
Monocytes Absolute: 0.4 10*3/uL (ref 0.1–1.0)
Monocytes Relative: 6.3 % (ref 3.0–12.0)
Neutro Abs: 3 10*3/uL (ref 1.4–7.7)
Neutrophils Relative %: 53.7 % (ref 43.0–77.0)
Platelets: 251 10*3/uL (ref 150.0–400.0)
RBC: 5.32 Mil/uL (ref 4.22–5.81)
RDW: 13.8 % (ref 11.5–15.5)
WBC: 5.6 10*3/uL (ref 4.0–10.5)

## 2021-12-11 ENCOUNTER — Encounter: Payer: Self-pay | Admitting: Gastroenterology

## 2021-12-11 ENCOUNTER — Ambulatory Visit (INDEPENDENT_AMBULATORY_CARE_PROVIDER_SITE_OTHER): Payer: BC Managed Care – PPO | Admitting: Gastroenterology

## 2021-12-11 DIAGNOSIS — D509 Iron deficiency anemia, unspecified: Secondary | ICD-10-CM | POA: Diagnosis not present

## 2021-12-11 NOTE — Progress Notes (Signed)
SN: [id number]  Exp: 04-29-2023 LOT: [id number]  Patient arrived for VCE. Reported the prep went well. This RN explained capsule dietary restrictions for the next few hours. Pt advised to return at 4 pm to return capsule equipment.  Patient verbalized understanding. Opened capsule, ensured capsule was flashing prior to the patient swallowing the capsule. Patient swallowed capsule without difficulty.  Patient told to call the office with any questions and if capsule has not passed after 72 hours. No further questions by the conclusion of the visit.

## 2021-12-11 NOTE — Patient Instructions (Signed)
You may have clear liquids beginning at 10:30 am after ingesting the capsule.    You can have a light lunch at 12:30 pm; sandwich and half bowl of soup.  Return to the office at 4 pm to return the equipment.   Return to you normal diet at 5 pm.   Call [phone] and ask for Nathan Hanna BSN RN if you have any questions.  You should pass the capsule in your stool 8-48 hours after ingestion. If you have not passed the capsule, after 72 hours, please contact the office at [phone].    

## 2021-12-17 ENCOUNTER — Telehealth: Payer: Self-pay | Admitting: Gastroenterology

## 2021-12-17 DIAGNOSIS — T184XXA Foreign body in colon, initial encounter: Secondary | ICD-10-CM

## 2021-12-17 NOTE — Telephone Encounter (Signed)
Inbound call from patients mother stated that he had a capsule procedure on 7/21 and patient has not passed the capsule. Patient mother is seeking advice, please advise.

## 2021-12-17 NOTE — Telephone Encounter (Signed)
Returned call to patient's mother. I informed her that if patient has not seen the capsule he will need to go by the x-ray department in the basement of our office building. I told her that no appt is necessary and that the pt can stop by at his convenience between 8 am - 5 pm. Pt's mother stated that patient will likely go tomorrow since he is working until 11 pm tonight. Pt's mother had no other concerns at the end of the call.  KUB order in epic.

## 2021-12-18 ENCOUNTER — Ambulatory Visit (INDEPENDENT_AMBULATORY_CARE_PROVIDER_SITE_OTHER)
Admission: RE | Admit: 2021-12-18 | Discharge: 2021-12-18 | Disposition: A | Discharge status: Home / Self Care | Payer: BC Managed Care – PPO | Source: Ambulatory Visit | Attending: Gastroenterology | Admitting: Gastroenterology

## 2021-12-18 ENCOUNTER — Telehealth: Payer: Self-pay | Admitting: Gastroenterology

## 2021-12-18 DIAGNOSIS — T184XXA Foreign body in colon, initial encounter: Secondary | ICD-10-CM

## 2021-12-18 DIAGNOSIS — D509 Iron deficiency anemia, unspecified: Secondary | ICD-10-CM

## 2021-12-18 NOTE — Addendum Note (Signed)
Addended by: Missy Sabins on: 12/18/2021 03:01 PM   Modules accepted: Orders

## 2021-12-18 NOTE — Telephone Encounter (Signed)
Patients mother called wanting to discuss results. Please advise.

## 2021-12-18 NOTE — Telephone Encounter (Signed)
Pt's mother called for x-ray results. I told her that the x-ray report is not available at this time. We reviewed the capsule endoscopy results and Dr. Lanetta Inch recommendations as outlined below. I advised pt's mother that patient needs to continue Protonix and iron supplement. Pt's mother is aware that we will repeat labs in about 3 months, they will receive a reminder closer to that time. Pt's mother is aware that we will contact them when x-ray report is available. Pt's mother verbalized understanding and had no concerns at the end of the call.  Lab orders and reminder in epic.

## 2021-12-18 NOTE — Telephone Encounter (Signed)
Capsule endoscopy reviewed.  This was done for iron deficiency anemia with negative EGD and colonoscopy for clear cause.  There is some mild pyloric inflammation but otherwise negative small bowel capsule endoscopy without any concerning findings.  He will continue his Protonix, continue iron supplementation, and continue to monitor his blood work.  His last CBC and iron studies looked stable this month.  I would like to repeat his labs again in 3 to 4 months for reassessment.  Nathan Hanna can you please relay the results of his capsule study to him, no concerning findings.  I  would like to repeat his labs  (CBC, TIBC / ferritin) in 3 to 4 months if you can help coordinate that.  Thanks

## 2022-03-22 ENCOUNTER — Telehealth: Payer: Self-pay

## 2022-03-22 NOTE — Telephone Encounter (Signed)
-----   Message from Yevette Edwards, RN sent at 12/18/2021  3:00 PM EDT ----- Regarding: Labs CBC, IBC + Ferritin - orders are in epic

## 2022-03-22 NOTE — Telephone Encounter (Signed)
MyChart message sent with lab reminder. 

## 2022-03-23 NOTE — Telephone Encounter (Signed)
Pt reviewed MyChart message. Last read by London Pepper. at  7:42 PM on 03/22/2022.

## 2022-06-05 IMAGING — DX DG TIBIA/FIBULA 2V*L*
4 series · 4 of 4 positions shown · non-contrast
Comparison: None.

CLINICAL DATA: Left lower leg injury, hematoma

EXAM:
LEFT TIBIA AND FIBULA - 2 VIEW

[tibia ap (1 of 2)]
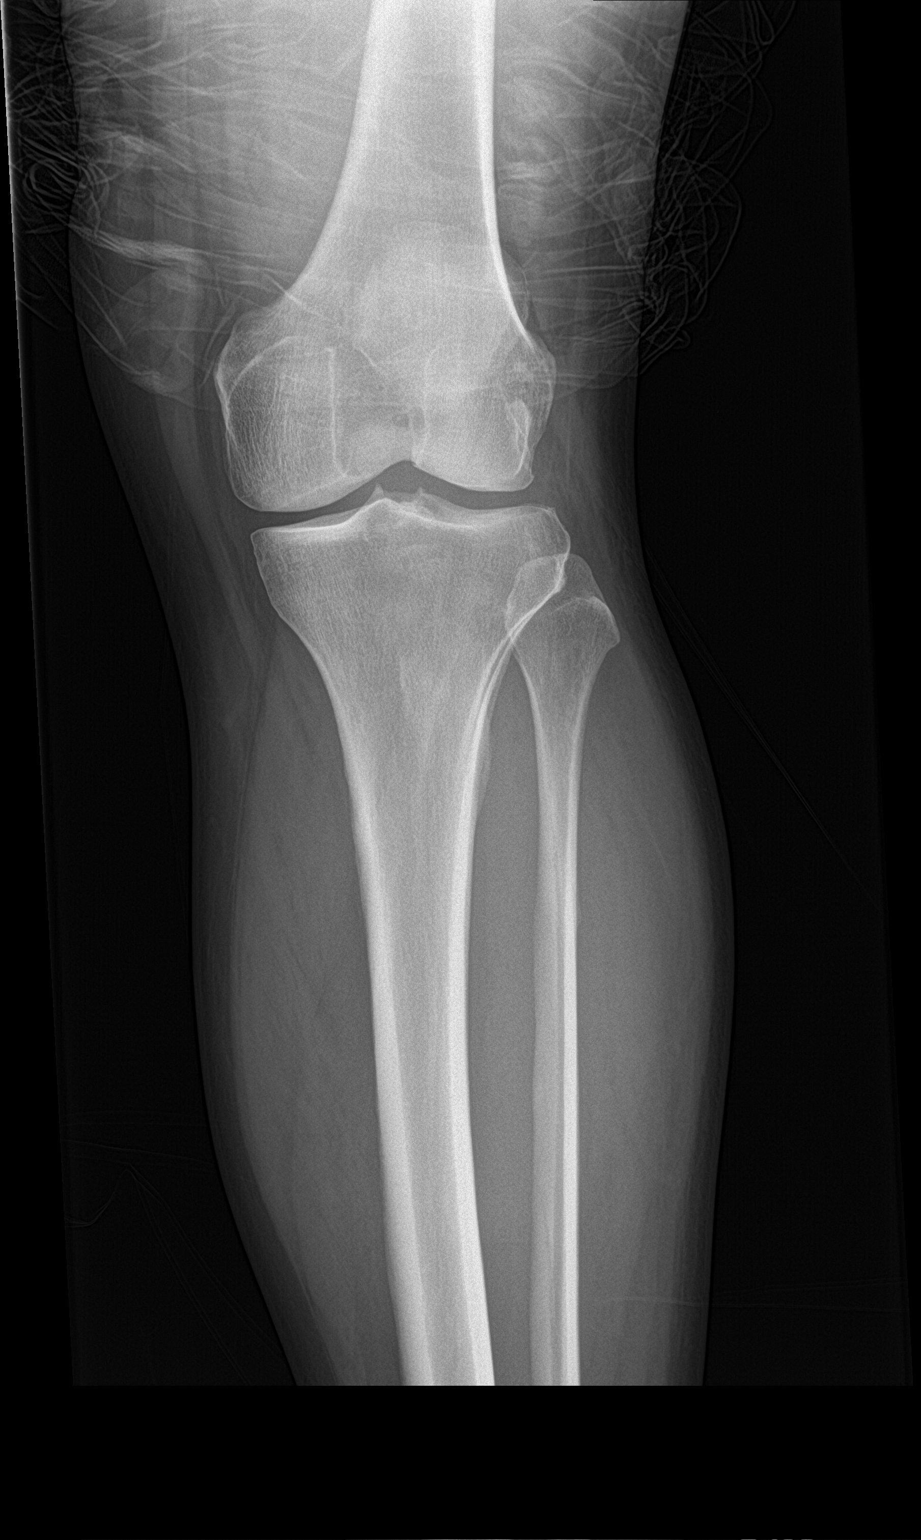

[tibia ap (2 of 2)]
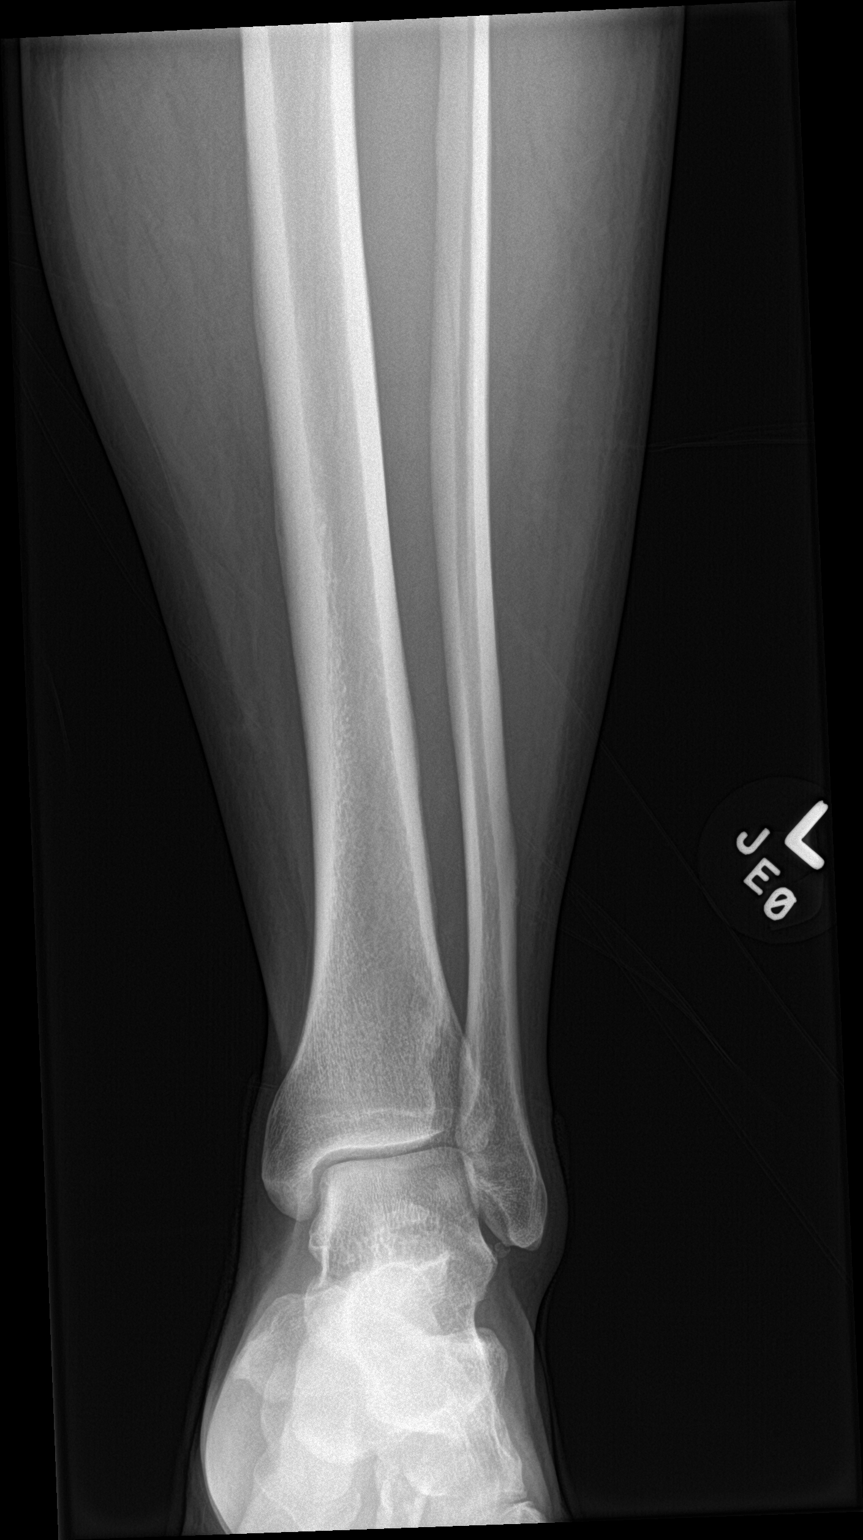

[tibia lat (1 of 2)]
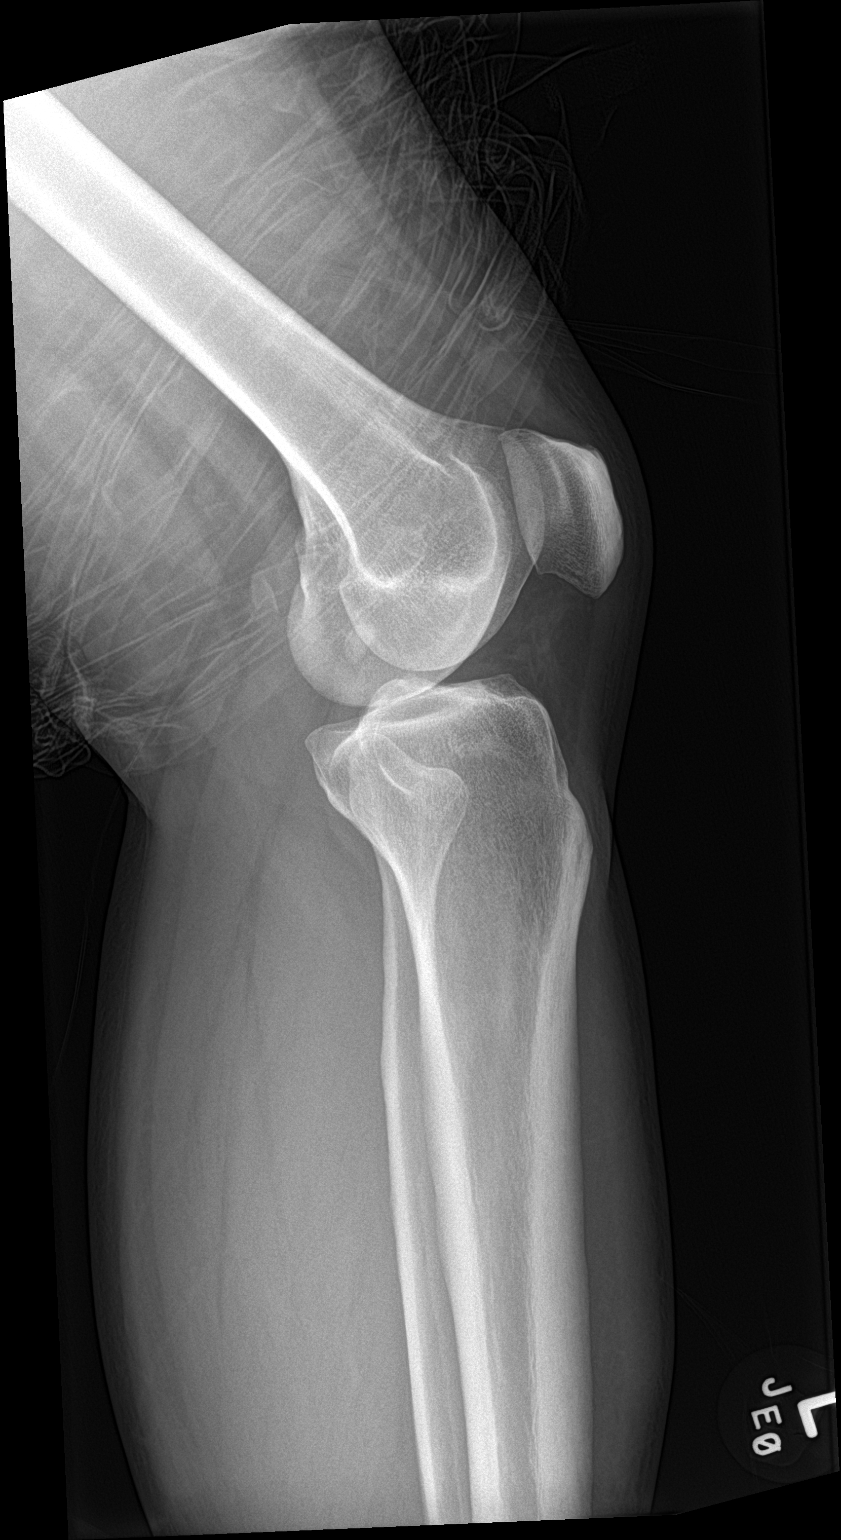

[tibia lat (2 of 2)]
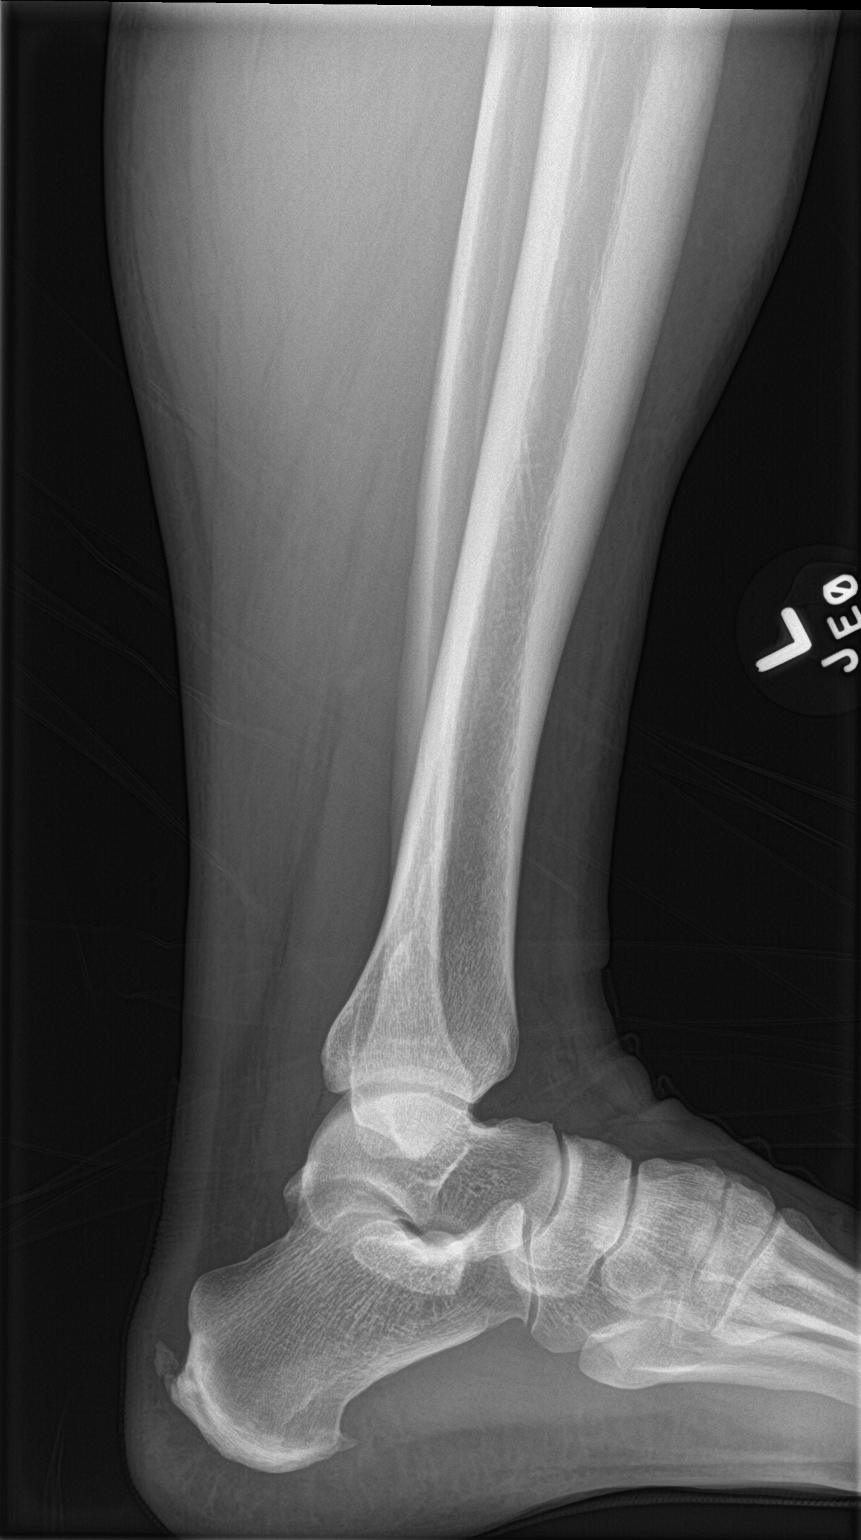

[4 of 4 positions shown; findings below may reference images not displayed]

FINDINGS: Frontal and lateral views of the left tibia and fibula are obtained.
No fracture, subluxation, or dislocation. Joint spaces of the left
knee and ankle are well preserved. There is soft tissue swelling
anteriorly within the proximal left lower leg.
IMPRESSION: 1. Proximal anterior soft tissue swelling.
2. No acute bony abnormality.
